# Patient Record
Sex: Female | Born: 1985 | Hispanic: No | Marital: Married | Smoking: Never smoker
Health system: Southern US, Community
[De-identification: ages and names within clinical notes are randomized; demographics above are authoritative.]

## PROBLEM LIST (undated history)

## (undated) DIAGNOSIS — E785 Hyperlipidemia, unspecified: Secondary | ICD-10-CM

## (undated) DIAGNOSIS — J45909 Unspecified asthma, uncomplicated: Secondary | ICD-10-CM

## (undated) HISTORY — PX: MOUTH SURGERY: SHX715

## (undated) HISTORY — DX: Unspecified asthma, uncomplicated: J45.909

## (undated) HISTORY — PX: WISDOM TOOTH EXTRACTION: SHX21

---
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
## 2012-09-08 ENCOUNTER — Ambulatory Visit (INDEPENDENT_AMBULATORY_CARE_PROVIDER_SITE_OTHER): Payer: BC Managed Care – PPO | Admitting: Obstetrics and Gynecology

## 2012-09-08 ENCOUNTER — Encounter: Payer: Self-pay | Admitting: Obstetrics and Gynecology

## 2012-09-08 VITALS — BP 102/74 | Resp 16 | Ht 64.0 in | Wt 126.0 lb

## 2012-09-08 DIAGNOSIS — Z3009 Encounter for other general counseling and advice on contraception: Secondary | ICD-10-CM

## 2012-09-08 DIAGNOSIS — IMO0001 Reserved for inherently not codable concepts without codable children: Secondary | ICD-10-CM

## 2012-09-08 NOTE — Patient Instructions (Signed)
Contraception Choices  Contraception (birth control) is the use of any methods or devices to prevent pregnancy. Below are some methods to help avoid pregnancy.  HORMONAL METHODS   · Contraceptive implant. This is a thin, plastic tube containing progesterone hormone. It does not contain estrogen hormone. Your caregiver inserts the tube in the inner part of the upper arm. The tube can remain in place for up to 3 years. After 3 years, the implant must be removed. The implant prevents the ovaries from releasing an egg (ovulation), thickens the cervical mucus which prevents sperm from entering the uterus, and thins the lining of the inside of the uterus.  · Progesterone-only injections. These injections are given every 3 months by your caregiver to prevent pregnancy. This synthetic progesterone hormone stops the ovaries from releasing eggs. It also thickens cervical mucus and changes the uterine lining. This makes it harder for sperm to survive in the uterus.  · Birth control pills. These pills contain estrogen and progesterone hormone. They work by stopping the egg from forming in the ovary (ovulation). Birth control pills are prescribed by a caregiver. Birth control pills can also be used to treat heavy periods.  · Minipill. This type of birth control pill contains only the progesterone hormone. They are taken every day of each month and must be prescribed by your caregiver.  · Birth control patch. The patch contains hormones similar to those in birth control pills. It must be changed once a week and is prescribed by a caregiver.  · Vaginal ring. The ring contains hormones similar to those in birth control pills. It is left in the vagina for 3 weeks, removed for 1 week, and then a new one is put back in place. The patient must be comfortable inserting and removing the ring from the vagina. A caregiver's prescription is necessary.  · Emergency contraception. Emergency contraceptives prevent pregnancy after unprotected  sexual intercourse. This pill can be taken right after sex or up to 5 days after unprotected sex. It is most effective the sooner you take the pills after having sexual intercourse. Emergency contraceptive pills are available without a prescription. Check with your pharmacist. Do not use emergency contraception as your only form of birth control.  BARRIER METHODS   · Female condom. This is a thin sheath (latex or rubber) that is worn over the penis during sexual intercourse. It can be used with spermicide to increase effectiveness.  · Female condom. This is a soft, loose-fitting sheath that is put into the vagina before sexual intercourse.  · Diaphragm. This is a soft, latex, dome-shaped barrier that must be fitted by a caregiver. It is inserted into the vagina, along with a spermicidal jelly. It is inserted before intercourse. The diaphragm should be left in the vagina for 6 to 8 hours after intercourse.  · Cervical cap. This is a round, soft, latex or plastic cup that fits over the cervix and must be fitted by a caregiver. The cap can be left in place for up to 48 hours after intercourse.  · Sponge. This is a soft, circular piece of polyurethane foam. The sponge has spermicide in it. It is inserted into the vagina after wetting it and before sexual intercourse.  · Spermicides. These are chemicals that kill or block sperm from entering the cervix and uterus. They come in the form of creams, jellies, suppositories, foam, or tablets. They do not require a prescription. They are inserted into the vagina with an applicator before having sexual intercourse.   The process must be repeated every time you have sexual intercourse.  INTRAUTERINE CONTRACEPTION  · Intrauterine device (IUD). This is a T-shaped device that is put in a woman's uterus during a menstrual period to prevent pregnancy. There are 2 types:  · Copper IUD. This type of IUD is wrapped in copper wire and is placed inside the uterus. Copper makes the uterus and  fallopian tubes produce a fluid that kills sperm. It can stay in place for 10 years.  · Hormone IUD. This type of IUD contains the hormone progestin (synthetic progesterone). The hormone thickens the cervical mucus and prevents sperm from entering the uterus, and it also thins the uterine lining to prevent implantation of a fertilized egg. The hormone can weaken or kill the sperm that get into the uterus. It can stay in place for 5 years.  PERMANENT METHODS OF CONTRACEPTION  · Female tubal ligation. This is when the woman's fallopian tubes are surgically sealed, tied, or blocked to prevent the egg from traveling to the uterus.  · Female sterilization. This is when the female has the tubes that carry sperm tied off (vasectomy). This blocks sperm from entering the vagina during sexual intercourse. After the procedure, the man can still ejaculate fluid (semen).  NATURAL PLANNING METHODS  · Natural family planning. This is not having sexual intercourse or using a barrier method (condom, diaphragm, cervical cap) on days the woman could become pregnant.  · Calendar method. This is keeping track of the length of each menstrual cycle and identifying when you are fertile.  · Ovulation method. This is avoiding sexual intercourse during ovulation.  · Symptothermal method. This is avoiding sexual intercourse during ovulation, using a thermometer and ovulation symptoms.  · Post-ovulation method. This is timing sexual intercourse after you have ovulated.  Regardless of which type or method of contraception you choose, it is important that you use condoms to protect against the transmission of sexually transmitted diseases (STDs). Talk with your caregiver about which form of contraception is most appropriate for you.  Document Released: 09/02/2005 Document Revised: 11/25/2011 Document Reviewed: 01/09/2011  ExitCare® Patient Information ©2013 [employer].

## 2012-09-08 NOTE — Progress Notes (Signed)
Pt is here for consultation about IUD insertion.  She went to [facility]  facility for an IUD on two different occasions.  Both times the staff was unable to place the IUd because of cervical stenosis per the pt.  She reports she had a cervical block and used cytotec.  Each insertion attempt was also done at the time of her menses.  She was dxd with a migraine with aura and stopped nuvaring.  She did not like the SE of nexplanon.  She does not think she will remember to take a pill.  She may consider Depo Provera BP 102/74  Resp 16  Ht 5\' 4"  (1.626 m)  Wt 126 lb (57.153 kg)  BMI 21.63 kg/m2  LMP 08/29/2012 Physical Examination: General appearance - alert, well appearing, and in no distress Chest - clear to auscultation, no wheezes, rales or rhonchi, symmetric air entry Heart - normal rate and regular rhythm Abdomen - soft, nontender, nondistended, no masses or organomegaly Pelvic - normal external genitalia, vulva, vagina, cervix, uterus and adnexa, exam declined by the patient Extremities - peripheral pulses normal, no pedal edema, no clubbing or cyanosis Pt desires IUD under US guidance cytotec 200 mcg in vagina 4-6 hrs befor procedure Will use skyla because it is smaller I reviewed all non hormonal and progesterone only contraception with the pt [facility] records

## 2012-09-21 ENCOUNTER — Ambulatory Visit: Payer: Self-pay | Admitting: Internal Medicine

## 2012-09-22 ENCOUNTER — Telehealth: Payer: Self-pay | Admitting: Obstetrics and Gynecology

## 2012-09-22 MED ORDER — MEDROXYPROGESTERONE ACETATE 150 MG/ML IM SUSP: 150.0000 mg | Freq: Once | INTRAMUSCULAR | Status: DC

## 2012-09-22 NOTE — Telephone Encounter (Signed)
Pt informed and rx sent to pharmacy  [name], CMA

## 2012-09-22 NOTE — Telephone Encounter (Signed)
LVM to advise that rx was called to [facility] pharmacy. Advised pt if this was incorrect pharmacy to call us with phone number for correct one.   [name], CMA

## 2012-09-22 NOTE — Telephone Encounter (Signed)
Advised pt that rx was sent to [facility] pharmacy

## 2012-09-22 NOTE — Telephone Encounter (Signed)
Pt may have Depo provera 150 mg IM q 3 months for one year.  Disp # 1 with 3 refills.  Pt should make a lab appointment to come in for the injection when she has her menses and follow up with me in three months

## 2012-09-22 NOTE — Telephone Encounter (Signed)
New pt seen on 09/08/12 for IUD consult. Has decided that she would prefer to have Rx for Depo Provera. Is it ok to call this in?  [name], CMA

## 2013-03-02 ENCOUNTER — Ambulatory Visit: Payer: Self-pay | Admitting: Family Medicine

## 2017-06-24 ENCOUNTER — Other Ambulatory Visit: Payer: Self-pay | Admitting: Gastroenterology

## 2017-06-24 DIAGNOSIS — R1013 Epigastric pain: Secondary | ICD-10-CM

## 2017-06-24 DIAGNOSIS — R140 Abdominal distension (gaseous): Secondary | ICD-10-CM

## 2017-06-27 ENCOUNTER — Ambulatory Visit
Admission: RE | Admit: 2017-06-27 | Discharge: 2017-06-27 | Disposition: A | Discharge status: Home / Self Care | Payer: 59 | Source: Ambulatory Visit | Attending: Gastroenterology | Admitting: Gastroenterology

## 2017-06-27 DIAGNOSIS — R140 Abdominal distension (gaseous): Secondary | ICD-10-CM

## 2017-06-27 DIAGNOSIS — R1013 Epigastric pain: Secondary | ICD-10-CM

## 2018-09-16 NOTE — L&D Delivery Note (Signed)
Pt was admitted for an induction She had cytotec placed and then progressed quickly to C/C /+2  She pushed for 45 min and then had a SVD of one live viable infant in the ROA position. Placenta -S/I. 2nd degree perineal tear closed with 3-0 chomic. Baby to NBN. EBL- 400cc

## 2018-12-01 LAB — OB RESULTS CONSOLE HIV ANTIBODY (ROUTINE TESTING): HIV: NONREACTIVE

## 2018-12-01 LAB — OB RESULTS CONSOLE GC/CHLAMYDIA
Chlamydia: NEGATIVE
Gonorrhea: NEGATIVE

## 2018-12-01 LAB — OB RESULTS CONSOLE ABO/RH: RH Type: POSITIVE

## 2018-12-01 LAB — OB RESULTS CONSOLE ANTIBODY SCREEN: Antibody Screen: NEGATIVE

## 2018-12-01 LAB — OB RESULTS CONSOLE RPR: RPR: NONREACTIVE

## 2018-12-01 LAB — OB RESULTS CONSOLE HEPATITIS B SURFACE ANTIGEN: Hepatitis B Surface Ag: NEGATIVE

## 2018-12-01 LAB — OB RESULTS CONSOLE RUBELLA ANTIBODY, IGM: Rubella: IMMUNE

## 2019-02-26 ENCOUNTER — Other Ambulatory Visit: Payer: Self-pay

## 2019-02-26 ENCOUNTER — Encounter (HOSPITAL_COMMUNITY): Payer: Self-pay | Admitting: *Deleted

## 2019-02-26 ENCOUNTER — Inpatient Hospital Stay (HOSPITAL_COMMUNITY)
Admission: AD | Admit: 2019-02-26 | Discharge: 2019-02-27 | Disposition: A | Discharge status: Home / Self Care | Payer: Managed Care, Other (non HMO) | Attending: Obstetrics and Gynecology | Admitting: Obstetrics and Gynecology

## 2019-02-26 DIAGNOSIS — O26892 Other specified pregnancy related conditions, second trimester: Secondary | ICD-10-CM | POA: Insufficient documentation

## 2019-02-26 DIAGNOSIS — Z0371 Encounter for suspected problem with amniotic cavity and membrane ruled out: Secondary | ICD-10-CM

## 2019-02-26 DIAGNOSIS — Z3A22 22 weeks gestation of pregnancy: Secondary | ICD-10-CM | POA: Insufficient documentation

## 2019-02-26 HISTORY — DX: Hyperlipidemia, unspecified: E78.5

## 2019-02-26 NOTE — MAU Note (Signed)
Have been constipated this wk. I was on the toilet tonight and pushing very hard. Liquid started coming out. When I pushed again more liquid came out. No fld leaks out unless I am pushing. Denies vag bleeding. Good FM. No pain. I do need something for constipation.

## 2019-02-26 NOTE — MAU Provider Note (Signed)
History     CSN: [id number]  Arrival date and time: 02/26/19 2217   First Provider Initiated Contact with Patient 02/26/19 2351      Chief Complaint  Patient presents with  . Vaginal Discharge   HPI  Ms.  Katelyn Wall is a 33 y.o. year old G24P0 female at [redacted]w[redacted]d weeks gestation who presents to MAU reporting she has been leaking fluid since 2330 on 02/26/19. She reports she has been constipated all week and she tried to have a BM tonight. She states she was "pushing really hard on the toilet & liquid came out after pushing 2 times. She denies any further leaking. She now thinks she just leaked urine while straining for the BM. She denies VB or pain. She reports last SI was "last week." She reports good (+) FM. She called her OB office and was instructed to come here for evaluation of LOF. She also reports "having a really good BM after arriving here and feels so much better."  Past Medical History:  Diagnosis Date  . Hyperlipidemia, mild     Past Surgical History:  Procedure Laterality Date  . MOUTH SURGERY    . WISDOM TOOTH EXTRACTION      Family History  Problem Relation Age of Onset  . Cancer Paternal Grandfather        lung cancer  . Cancer Paternal Grandmother        skin cancer  . Cancer Maternal Grandmother        breast cancer/ GGM    Social History   Tobacco Use  . Smoking status: Never Smoker  . Smokeless tobacco: Never Used  Substance Use Topics  . Alcohol use: Not Currently  . Drug use: Never    Allergies: No Known Allergies  Medications Prior to Admission  Medication Sig Dispense Refill Last Dose  . Prenatal Vit-Fe Fumarate-FA (PRENATAL MULTIVITAMIN) TABS tablet Take 1 tablet by mouth daily at 12 noon.     . medroxyPROGESTERone (DEPO-PROVERA) 150 MG/ML injection Inject 1 mL (150 mg total) into the muscle once. 1 mL 3     Review of Systems Physical Exam   Blood pressure 117/66, pulse 99, temperature 98 F (36.7 C), resp. rate 18, height 5\' 4"   (1.626 m), weight 76.7 kg.  Physical Exam  Nursing note and vitals reviewed. Constitutional: She is oriented to person, place, and time. She appears well-developed.  HENT:  Head: Normocephalic and atraumatic.  Eyes: Pupils are equal, round, and reactive to light.  Neck: Normal range of motion.  Cardiovascular: Normal rate.  Respiratory: Effort normal.  GI: Soft.  Genitourinary:    Genitourinary Comments: Effacement (%): Thick Cervical Position: Posterior Presentation: Undeterminable Exam by: Sunday Corn, CNM    Musculoskeletal: Normal range of motion.  Neurological: She is alert and oriented to person, place, and time.  Skin: Skin is warm and dry.  Psychiatric: She has a normal mood and affect. Her behavior is normal. Judgment and thought content normal.    MAU Course  Procedures  MDM Speculum exam SVE Fern slide  Results for orders placed or performed during the hospital encounter of 02/26/19 (from the past 24 hour(s))  POCT fern test     Status: Normal   Collection Time: 02/27/19 12:33 AM  Result Value Ref Range   POCT Fern Test Negative = intact amniotic membranes     Assessment and Plan  No leakage of amniotic fluid into vagina  - Reassurance given that her membranes had not ruptured  d/t straining for BM - Discharge patient, - Keep scheduled appt with [facility] as scheduled - Patient verbalized an understanding of the plan of care and agrees.    Raelyn Moraolitta Hassan Blackshire, MSN, CNM 02/27/2019, 1:06 AM

## 2019-02-27 DIAGNOSIS — Z0371 Encounter for suspected problem with amniotic cavity and membrane ruled out: Secondary | ICD-10-CM | POA: Diagnosis not present

## 2019-02-27 DIAGNOSIS — Z3A22 22 weeks gestation of pregnancy: Secondary | ICD-10-CM | POA: Diagnosis not present

## 2019-02-27 DIAGNOSIS — O26892 Other specified pregnancy related conditions, second trimester: Secondary | ICD-10-CM | POA: Diagnosis present

## 2019-02-27 LAB — POCT FERN TEST: POCT Fern Test: NEGATIVE

## 2019-05-26 IMAGING — US US ABDOMEN COMPLETE
1 series · 14 of 25 positions shown · non-contrast
Comparison: None.

CLINICAL DATA: Epigastric pain

EXAM:
ABDOMEN ULTRASOUND COMPLETE

[Series 1: us abdomen complete · 0.22mm/px · 14 of 104 slices shown]
[im 1/104]
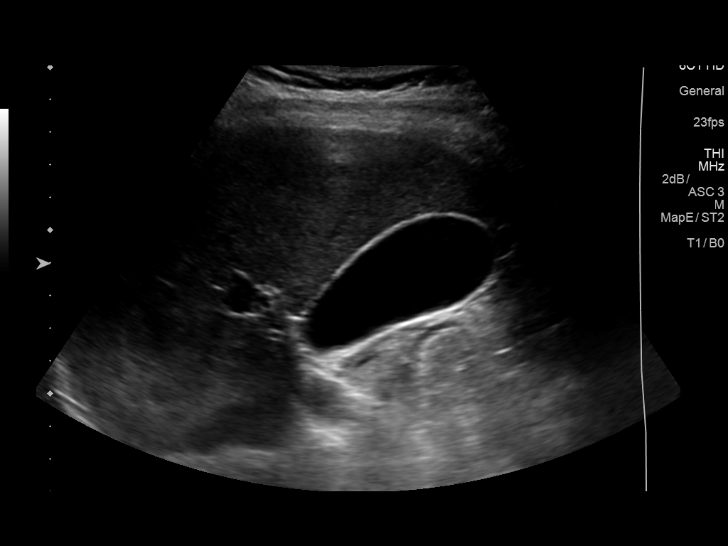
[im 9/104]
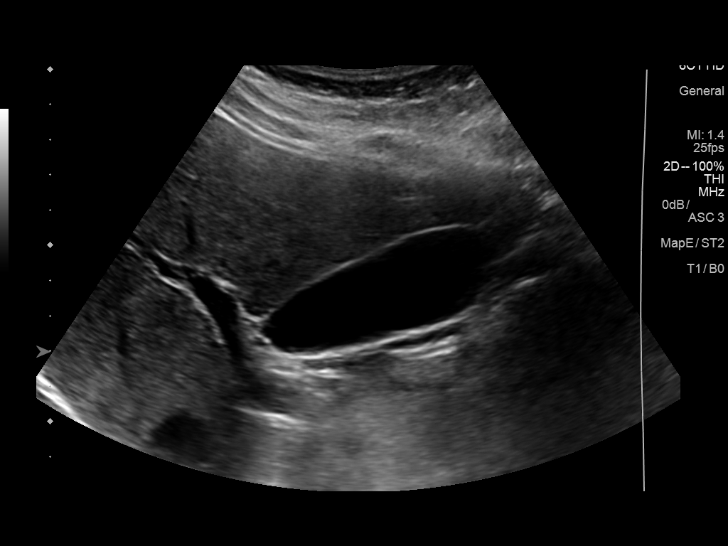
[im 18/104]
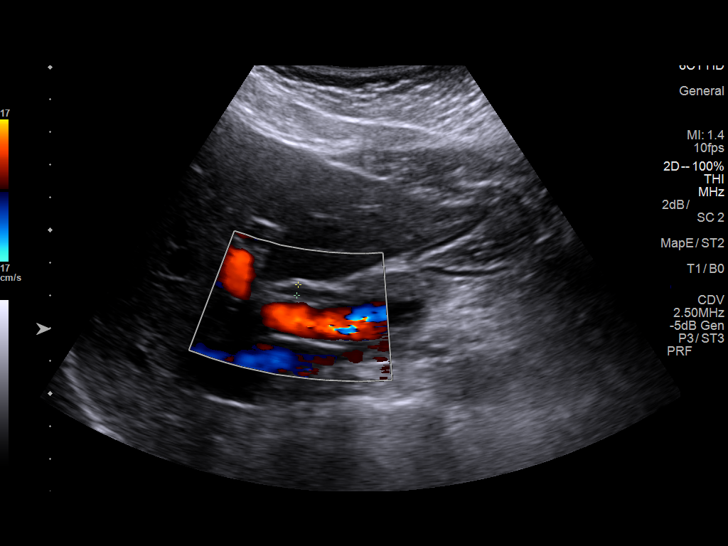
[im 26/104]
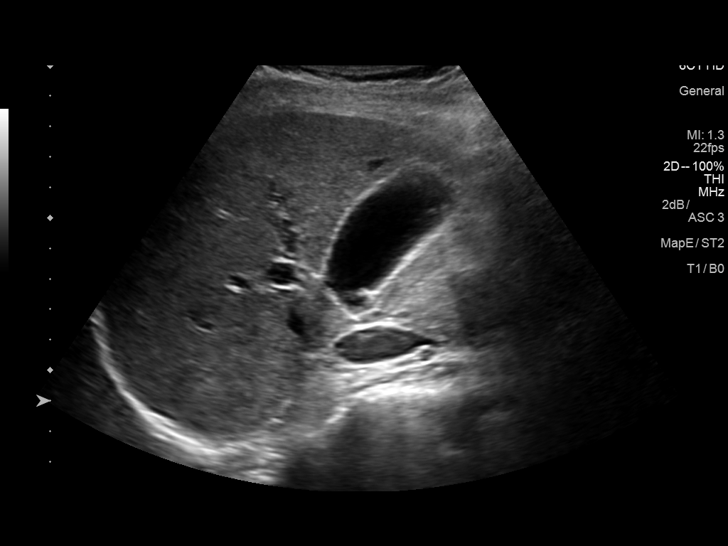
[im 35/104]
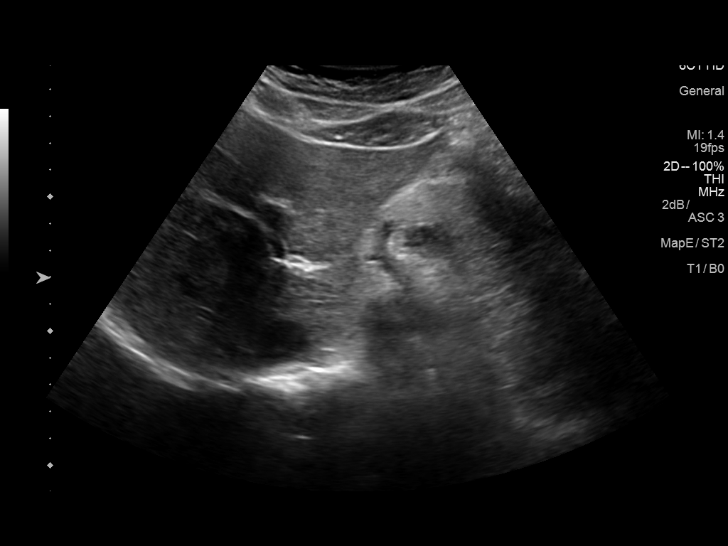
[im 39/104]
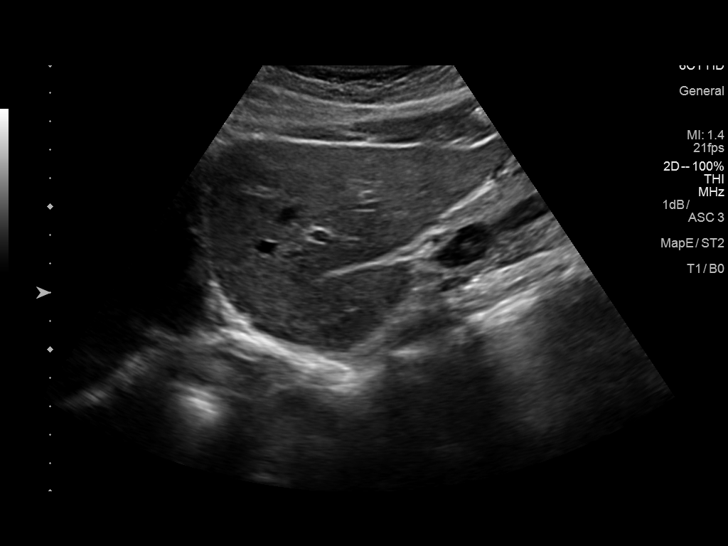
[im 48/104]
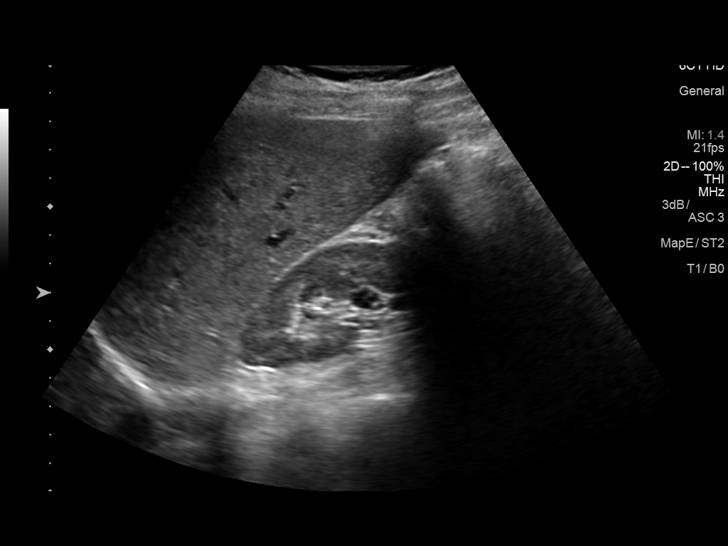
[im 56/104]
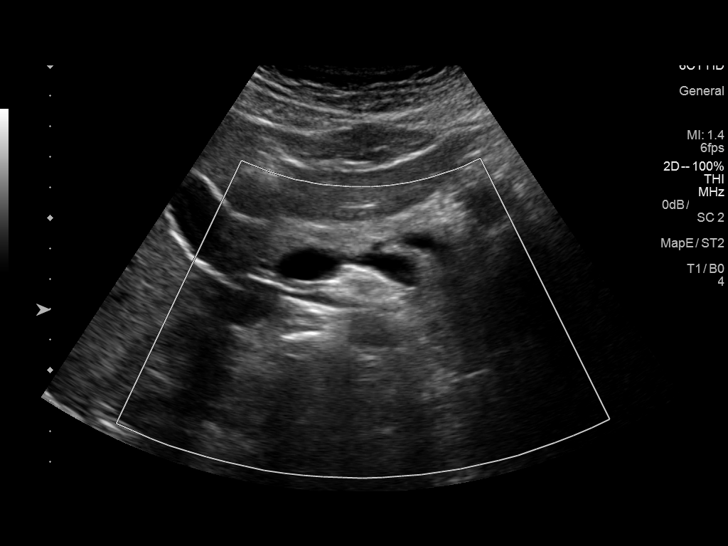
[im 65/104]
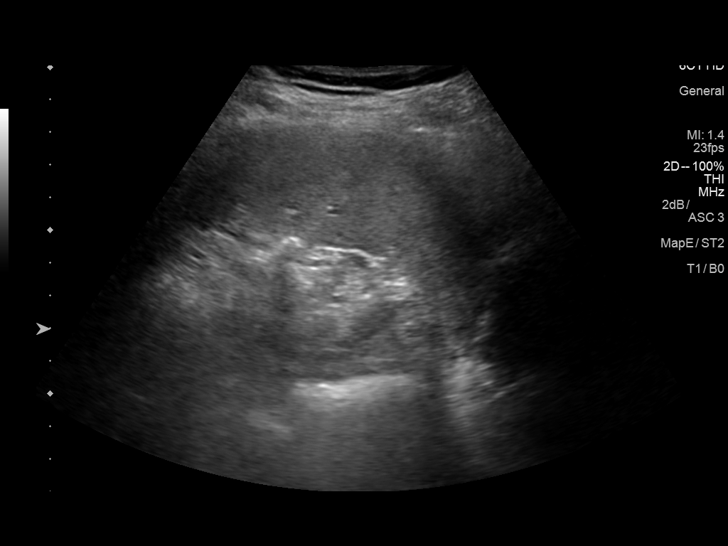
[im 69/104]
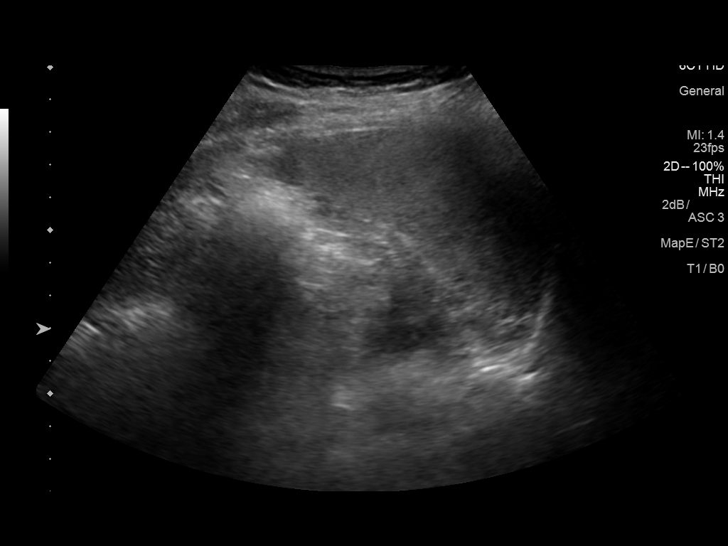
[im 78/104]
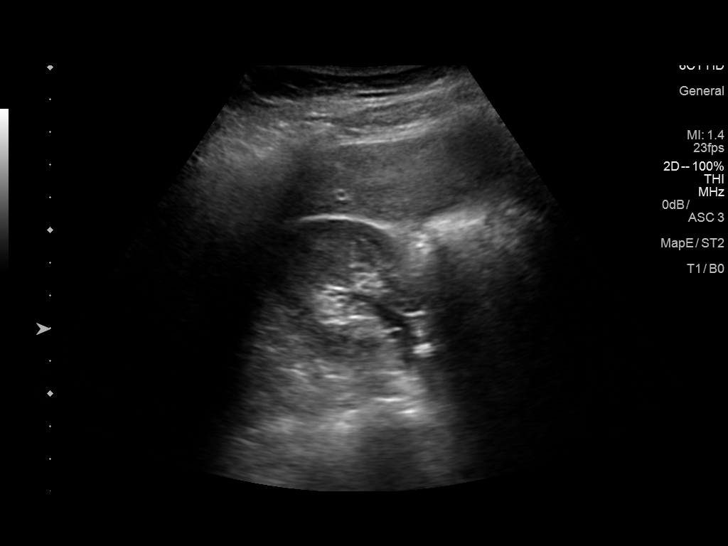
[im 86/104]
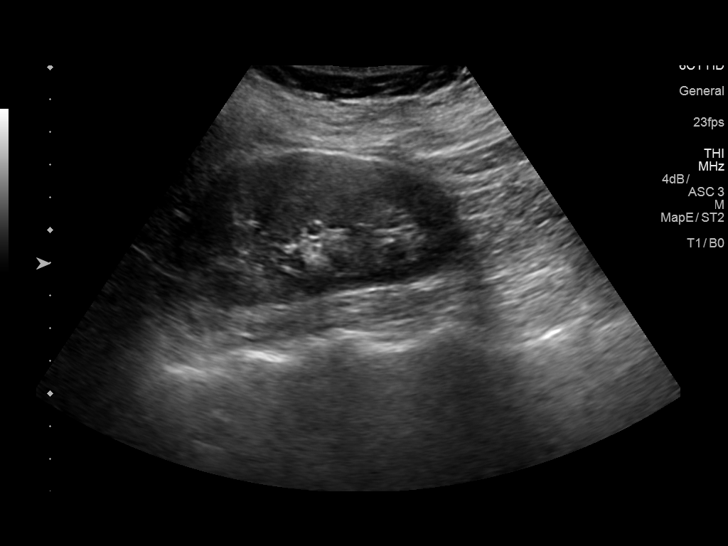
[im 95/104]
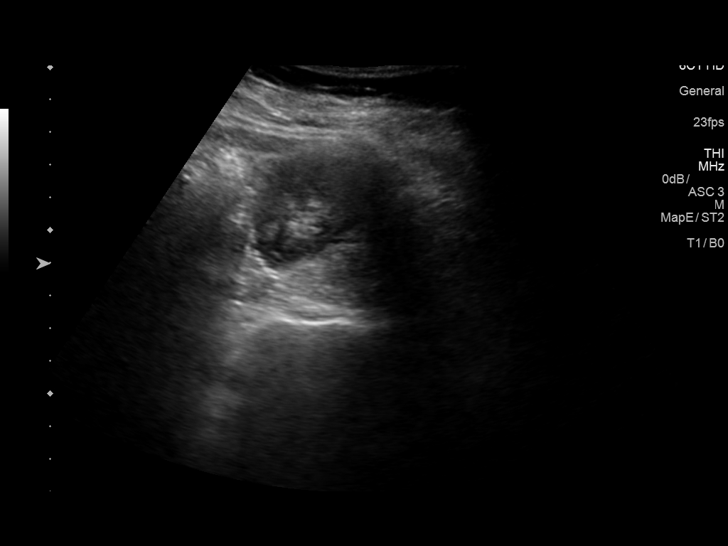
[im 104/104]
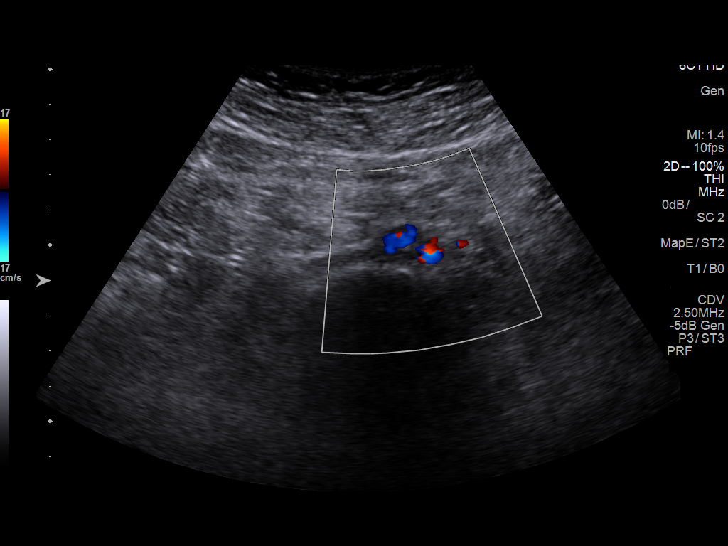

[14 of 25 positions shown; findings below may reference images not displayed]

FINDINGS: Gallbladder: No gallstones or wall thickening visualized. No
sonographic Murphy sign noted by sonographer.

Common bile duct: Diameter: Normal caliber, 3 mm

Liver: No focal lesion identified. Within normal limits in
parenchymal echogenicity. Portal vein is patent on color Doppler
imaging with normal direction of blood flow towards the liver.

IVC: No abnormality visualized.

Pancreas: Visualized portion unremarkable.

Spleen: Size and appearance within normal limits.

Right Kidney: Length: 10.2 cm. Echogenicity within normal limits. No
mass or hydronephrosis visualized.

Left Kidney: Length: 11.3 cm. Echogenicity within normal limits. No
mass or hydronephrosis visualized.

Abdominal aorta: No aneurysm visualized.

Other findings: None.
IMPRESSION: Normal ultrasound.

## 2019-06-01 LAB — OB RESULTS CONSOLE GBS: GBS: NEGATIVE

## 2019-07-05 ENCOUNTER — Other Ambulatory Visit: Payer: Self-pay | Admitting: Obstetrics and Gynecology

## 2019-07-05 ENCOUNTER — Telehealth (HOSPITAL_COMMUNITY): Payer: Self-pay | Admitting: *Deleted

## 2019-07-05 ENCOUNTER — Other Ambulatory Visit (HOSPITAL_COMMUNITY): Payer: Managed Care, Other (non HMO)

## 2019-07-05 ENCOUNTER — Encounter (HOSPITAL_COMMUNITY): Payer: Self-pay | Admitting: *Deleted

## 2019-07-05 NOTE — Telephone Encounter (Signed)
Preadmission screen  

## 2019-07-06 ENCOUNTER — Other Ambulatory Visit: Payer: Self-pay

## 2019-07-06 ENCOUNTER — Other Ambulatory Visit (HOSPITAL_COMMUNITY): Payer: Self-pay | Admitting: *Deleted

## 2019-07-06 ENCOUNTER — Other Ambulatory Visit (HOSPITAL_COMMUNITY)
Admission: RE | Admit: 2019-07-06 | Discharge: 2019-07-06 | Disposition: A | Discharge status: Home / Self Care | Payer: Managed Care, Other (non HMO) | Source: Ambulatory Visit | Attending: Obstetrics & Gynecology | Admitting: Obstetrics & Gynecology

## 2019-07-06 LAB — SARS CORONAVIRUS 2 BY RT PCR (HOSPITAL ORDER, PERFORMED IN ~~LOC~~ HOSPITAL LAB): SARS Coronavirus 2: NEGATIVE

## 2019-07-06 NOTE — MAU Note (Signed)
Asymptomatic, swab collected. 

## 2019-07-07 ENCOUNTER — Inpatient Hospital Stay (HOSPITAL_COMMUNITY): Payer: Managed Care, Other (non HMO)

## 2019-07-08 ENCOUNTER — Encounter (HOSPITAL_COMMUNITY): Payer: Self-pay

## 2019-07-08 ENCOUNTER — Other Ambulatory Visit: Payer: Self-pay

## 2019-07-08 ENCOUNTER — Encounter (HOSPITAL_COMMUNITY): Payer: Self-pay | Admitting: Anesthesiology

## 2019-07-08 ENCOUNTER — Inpatient Hospital Stay (HOSPITAL_COMMUNITY)
Admission: AD | Admit: 2019-07-08 | Discharge: 2019-07-09 | DRG: 807 | Disposition: A | Discharge status: Home / Self Care | Payer: Managed Care, Other (non HMO) | Attending: Obstetrics and Gynecology | Admitting: Obstetrics and Gynecology

## 2019-07-08 DIAGNOSIS — Z3490 Encounter for supervision of normal pregnancy, unspecified, unspecified trimester: Secondary | ICD-10-CM

## 2019-07-08 DIAGNOSIS — Z0371 Encounter for suspected problem with amniotic cavity and membrane ruled out: Secondary | ICD-10-CM

## 2019-07-08 DIAGNOSIS — O480 Post-term pregnancy: Principal | ICD-10-CM | POA: Diagnosis present

## 2019-07-08 DIAGNOSIS — Z20828 Contact with and (suspected) exposure to other viral communicable diseases: Secondary | ICD-10-CM | POA: Diagnosis present

## 2019-07-08 DIAGNOSIS — Z3A41 41 weeks gestation of pregnancy: Secondary | ICD-10-CM

## 2019-07-08 LAB — CBC
HCT: 42.9 % (ref 36.0–46.0)
Hemoglobin: 14.1 g/dL (ref 12.0–15.0)
MCH: 30 pg (ref 26.0–34.0)
MCHC: 32.9 g/dL (ref 30.0–36.0)
MCV: 91.3 fL (ref 80.0–100.0)
Platelets: 150 10*3/uL (ref 150–400)
RBC: 4.7 MIL/uL (ref 3.87–5.11)
RDW: 12.6 % (ref 11.5–15.5)
WBC: 9.6 10*3/uL (ref 4.0–10.5)
nRBC: 0 % (ref 0.0–0.2)

## 2019-07-08 LAB — TYPE AND SCREEN
ABO/RH(D): A POS
Antibody Screen: NEGATIVE

## 2019-07-08 LAB — RPR: RPR Ser Ql: NONREACTIVE

## 2019-07-08 LAB — ABO/RH: ABO/RH(D): A POS

## 2019-07-08 MED ORDER — PHENYLEPHRINE 40 MCG/ML (10ML) SYRINGE FOR IV PUSH (FOR BLOOD PRESSURE SUPPORT): 80.0000 ug | PREFILLED_SYRINGE | INTRAVENOUS | Status: DC | PRN

## 2019-07-08 MED ORDER — ZOLPIDEM TARTRATE 5 MG PO TABS: 5.0000 mg | ORAL_TABLET | Freq: Every evening | ORAL | Status: DC | PRN

## 2019-07-08 MED ORDER — OXYCODONE-ACETAMINOPHEN 5-325 MG PO TABS: 1.0000 | ORAL_TABLET | ORAL | Status: DC | PRN

## 2019-07-08 MED ORDER — ONDANSETRON HCL 4 MG/2ML IJ SOLN: 4.0000 mg | Freq: Four times a day (QID) | INTRAMUSCULAR | Status: DC | PRN

## 2019-07-08 MED ORDER — SIMETHICONE 80 MG PO CHEW: 80.0000 mg | CHEWABLE_TABLET | ORAL | Status: DC | PRN

## 2019-07-08 MED ORDER — OXYTOCIN BOLUS FROM INFUSION
500.0000 mL | Freq: Once | INTRAVENOUS | Status: AC
  Administered 2019-07-08: 500 mL/h via INTRAVENOUS

## 2019-07-08 MED ORDER — FENTANYL CITRATE (PF) 100 MCG/2ML IJ SOLN
INTRAMUSCULAR | Status: AC
  Filled 2019-07-08: qty 2

## 2019-07-08 MED ORDER — IBUPROFEN 600 MG PO TABS
600.0000 mg | ORAL_TABLET | Freq: Four times a day (QID) | ORAL | Status: DC
  Administered 2019-07-08 – 2019-07-09 (×5): 600 mg via ORAL
  Filled 2019-07-08 (×5): qty 1

## 2019-07-08 MED ORDER — OXYCODONE-ACETAMINOPHEN 5-325 MG PO TABS: 2.0000 | ORAL_TABLET | ORAL | Status: DC | PRN

## 2019-07-08 MED ORDER — WITCH HAZEL-GLYCERIN EX PADS: 1.0000 "application " | MEDICATED_PAD | CUTANEOUS | Status: DC | PRN

## 2019-07-08 MED ORDER — COCONUT OIL OIL: 1.0000 "application " | TOPICAL_OIL | Status: DC | PRN

## 2019-07-08 MED ORDER — SENNOSIDES-DOCUSATE SODIUM 8.6-50 MG PO TABS
2.0000 | ORAL_TABLET | ORAL | Status: DC
  Administered 2019-07-08: 2 via ORAL
  Filled 2019-07-08: qty 2

## 2019-07-08 MED ORDER — OXYTOCIN 40 UNITS IN NORMAL SALINE INFUSION - SIMPLE MED
2.5000 [IU]/h | INTRAVENOUS | Status: DC
  Filled 2019-07-08: qty 1000

## 2019-07-08 MED ORDER — DIPHENHYDRAMINE HCL 50 MG/ML IJ SOLN: 12.5000 mg | INTRAMUSCULAR | Status: DC | PRN

## 2019-07-08 MED ORDER — EPHEDRINE 5 MG/ML INJ: 10.0000 mg | INTRAVENOUS | Status: DC | PRN

## 2019-07-08 MED ORDER — MISOPROSTOL 25 MCG QUARTER TABLET
25.0000 ug | ORAL_TABLET | ORAL | Status: DC | PRN
  Administered 2019-07-08: 25 ug via VAGINAL
  Filled 2019-07-08 (×2): qty 1

## 2019-07-08 MED ORDER — LACTATED RINGERS IV SOLN: 500.0000 mL | INTRAVENOUS | Status: DC | PRN

## 2019-07-08 MED ORDER — LIDOCAINE HCL (PF) 1 % IJ SOLN
30.0000 mL | INTRAMUSCULAR | Status: DC | PRN
  Administered 2019-07-08: 30 mL via SUBCUTANEOUS
  Filled 2019-07-08: qty 30

## 2019-07-08 MED ORDER — BUTORPHANOL TARTRATE 1 MG/ML IJ SOLN
1.0000 mg | INTRAMUSCULAR | Status: DC | PRN
  Administered 2019-07-08: 1 mg via INTRAVENOUS
  Filled 2019-07-08: qty 1

## 2019-07-08 MED ORDER — ONDANSETRON HCL 4 MG PO TABS: 4.0000 mg | ORAL_TABLET | ORAL | Status: DC | PRN

## 2019-07-08 MED ORDER — LACTATED RINGERS IV SOLN: 500.0000 mL | Freq: Once | INTRAVENOUS | Status: DC

## 2019-07-08 MED ORDER — FENTANYL-BUPIVACAINE-NACL 0.5-0.125-0.9 MG/250ML-% EP SOLN
12.0000 mL/h | EPIDURAL | Status: DC | PRN
  Filled 2019-07-08: qty 250

## 2019-07-08 MED ORDER — ACETAMINOPHEN 325 MG PO TABS: 650.0000 mg | ORAL_TABLET | ORAL | Status: DC | PRN

## 2019-07-08 MED ORDER — BENZOCAINE-MENTHOL 20-0.5 % EX AERO: 1.0000 "application " | INHALATION_SPRAY | CUTANEOUS | Status: DC | PRN

## 2019-07-08 MED ORDER — SOD CITRATE-CITRIC ACID 500-334 MG/5ML PO SOLN: 30.0000 mL | ORAL | Status: DC | PRN

## 2019-07-08 MED ORDER — FENTANYL-BUPIVACAINE-NACL 0.5-0.125-0.9 MG/250ML-% EP SOLN: 12.0000 mL/h | EPIDURAL | Status: DC | PRN

## 2019-07-08 MED ORDER — ONDANSETRON HCL 4 MG/2ML IJ SOLN: 4.0000 mg | INTRAMUSCULAR | Status: DC | PRN

## 2019-07-08 MED ORDER — MEASLES, MUMPS & RUBELLA VAC IJ SOLR: 0.5000 mL | Freq: Once | INTRAMUSCULAR | Status: DC

## 2019-07-08 MED ORDER — TETANUS-DIPHTH-ACELL PERTUSSIS 5-2.5-18.5 LF-MCG/0.5 IM SUSP: 0.5000 mL | Freq: Once | INTRAMUSCULAR | Status: DC

## 2019-07-08 MED ORDER — TERBUTALINE SULFATE 1 MG/ML IJ SOLN: 0.2500 mg | Freq: Once | INTRAMUSCULAR | Status: DC | PRN

## 2019-07-08 MED ORDER — DIBUCAINE (PERIANAL) 1 % EX OINT: 1.0000 "application " | TOPICAL_OINTMENT | CUTANEOUS | Status: DC | PRN

## 2019-07-08 MED ORDER — LACTATED RINGERS IV SOLN
INTRAVENOUS | Status: DC
  Administered 2019-07-08: via INTRAVENOUS

## 2019-07-08 NOTE — Lactation Note (Signed)
This note was copied from a baby's chart. Lactation Consultation Note  Patient Name: [name] FVCBS'W Date: 07/08/2019 Reason for consult: Initial assessment;Primapara;1st time breastfeeding;Term  9675 - 1632 - I conducted an initial consult with Ms. Wyke. She states that her daughter latched in recovery and again around 10 am. Ms. Alkema has attempted to latch her since that time, but baby has been too sleepy to wake.   I offered to assist and she agreed. We attempted to wake baby up, and we placed her in cradle hold. Baby went to sleep at the breast and would not latch.   I encouraged Ms. Trethewey to hand express and feed back any EBM. I reviewed key points from the [facility] guide, and reviewed feeding cues, and output expectations.  I helped swaddle baby, and encouraged her to try to feed the baby upon demand and wake baby to feed.  Maternal Data Formula Feeding for Exclusion: No Has patient been taught Hand Expression?: Yes Does the patient have breastfeeding experience prior to this delivery?: No  Feeding Feeding Type: Breast Fed  LATCH Score Latch: Too sleepy or reluctant, no latch achieved, no sucking elicited.  Audible Swallowing: None  Type of Nipple: Everted at rest and after stimulation  Comfort (Breast/Nipple): Soft / non-tender  Hold (Positioning): Assistance needed to correctly position infant at breast and maintain latch.  LATCH Score: 5  Interventions Interventions: Breast feeding basics reviewed;Assisted with latch;Skin to skin;Hand express;Adjust position  Lactation Tools Discussed/Used     Consult Status Consult Status: Follow-up Date: 07/09/19 Follow-up type: In-patient    Lenore Manner 07/08/2019, 5:11 PM

## 2019-07-08 NOTE — H&P (Signed)
Katelyn Wall is an 33 y.o. G1P0 [redacted]w[redacted]d white female who was admitted to L&D for an induction for post dates. PNC was uncomplicated. GBS-neg. / NIPT -wnl/ Nl 3 hr OGTT  Past Medical History:  Diagnosis Date  . Asthma   . Hyperlipidemia, mild     Past Surgical History:  Procedure Laterality Date  . MOUTH SURGERY    . WISDOM TOOTH EXTRACTION      Family History  Problem Relation Age of Onset  . Cancer Paternal Grandfather        lung cancer  . Cancer Paternal Grandmother        skin cancer  . Varicose Veins Paternal Grandmother   . Cancer Maternal Grandmother        breast cancer/ GGM  . Varicose Veins Maternal Grandmother   . Varicose Veins Mother   . Hyperlipidemia Father   . Stroke Father    Social History:  reports that she has never smoked. She has never used smokeless tobacco. She reports previous alcohol use. She reports that she does not use drugs.  Allergies: No Known Allergies  Medications Prior to Admission  Medication Sig Dispense Refill  . Prenatal Vit-Fe Fumarate-FA (PRENATAL MULTIVITAMIN) TABS tablet Take 1 tablet by mouth daily at 12 noon.         Blood pressure 133/74, pulse 71, temperature 98.4 F (36.9 C), temperature source Oral, resp. rate 15, height 5\' 4"  (1.626 m), weight 84.6 kg. General appearance: alert and cooperative Abdomen: gravid, non tender   Lab Results  Component Value Date   WBC 9.6 07/08/2019   HGB 14.1 07/08/2019   HCT 42.9 07/08/2019   MCV 91.3 07/08/2019   PLT 150 07/08/2019   No results found for: PREGTESTUR, PREGSERUM, HCG, HCGQUANT    Patient Active Problem List   Diagnosis Date Noted  . Indication for care in labor or delivery 07/08/2019  . No leakage of amniotic fluid into vagina 02/27/2019   IMP/ IUP at 41 wks         Post dates Plan/ Admit  Olga Millers 07/08/2019, 6:36 AM

## 2019-07-09 LAB — CBC
HCT: 40.2 % (ref 36.0–46.0)
Hemoglobin: 13.4 g/dL (ref 12.0–15.0)
MCH: 30.7 pg (ref 26.0–34.0)
MCHC: 33.3 g/dL (ref 30.0–36.0)
MCV: 92.2 fL (ref 80.0–100.0)
Platelets: 126 10*3/uL — ABNORMAL LOW (ref 150–400)
RBC: 4.36 MIL/uL (ref 3.87–5.11)
RDW: 13.1 % (ref 11.5–15.5)
WBC: 10.4 10*3/uL (ref 4.0–10.5)
nRBC: 0 % (ref 0.0–0.2)

## 2019-07-09 MED ORDER — IBUPROFEN 600 MG PO TABS: 600.0000 mg | ORAL_TABLET | Freq: Four times a day (QID) | ORAL | 0 refills | Status: AC

## 2019-07-09 NOTE — Progress Notes (Signed)
CSW received consult for adjustment disorder.  CSW met with MOB to offer support and complete assessment.    MOB laying in bed holding infant, when CSW entered the room. CSW introduced self and explained reason for consult to which MOB expressed understanding. MOB very pleasant and engaged throughout assessment and appeared to be well-bonded to infant. CSW inquired about MOB's diagnosis of adjustment disorder and MOB denied diagnosis. MOB reported she felt good during pregnancy but did acknowledgd experiencing some mood swings due to her hormones. MOB denied currently being on medications or receiving counseling. 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 CSW recommended self-evaluation during the postpartum time period using the New Mom Checklist from Postpartum Progress and encouraged MOB to contact a medical professional if symptoms are noted at any time. MOB appeared to be in good-spirits and did not appear to be displaying any acute mental health symptoms. MOB denied any current SI, HI or DV and reported having good support from FOB, her mother-in-law, her father-in-law and her mother.    MOB confirmed having all essential items for infant once discharged and stated infant would be sleeping in a crib of bassinet once home. CSW provided review of Sudden Infant Death Syndrome (SIDS) precautions and safe sleeping habits.    CSW identifies no further need for intervention and no barriers to discharge at this time.  Elijio Miles, [place]  [facility] [employer] [phone]

## 2019-07-09 NOTE — Lactation Note (Addendum)
This note was copied from a baby's chart. Lactation Consultation Note  Patient Name: Katelyn Wall GGEZM'O Date: 07/09/2019 Reason for consult: Follow-up assessment;Infant weight loss;Primapara;Term  LC in to visit with P2 Mom of term baby at 41 hrs old.  Baby is at 7.8% weight loss, output WNL, non since 0300.  Mom reports + breast changes with pregnancy.   Mom states baby has been breastfeeding longer since last night after a slow start yesterday due to sleepiness and emesis.     Baby swaddled in crib after having fed for 40 mins an hour before.  Reviewed breast massage and hand expression, small glistening of colostrum noted.  Baby unwrapped and undressed baby to assist with positioning baby at the breast.  After a couple attempts, baby opened wide, and Mom assisted with using cross cradle hold rather than cradle. Mom initially was pulling breast flat and using cradle hold.  Baby able to latch deeply and sustain latch.  Tongue noted cupped under nipple. LC unable to identify more than 2 swallows while observing baby for 15 mins.   Mom taught to use breast compression during feedings.  Nipple rounded and not pinched after feeding.  Shared concern with Mom about 7.8% weight loss in 24 hrs.  Mom encouraged to post breastfeed double pump to stimulate her milk supply, and offer baby EBM.  Mom does not want to give baby formula if possible.   Set up DEBP and assisted Mom with her first pumping.  Unable to express any colostrum currently.  Mom has a Medela DEBP at home.  Talked to RN about re-weighing baby this afternoon to make sure weight loss is stable.  Baby to see Pediatrician in am after discharge.   Plan- 1- Keep baby STS as much as possible 2- Offer breast with cues, goal of >8 times per 24 hrs.  Awaken baby well at 3 hrs if sleepy. 3- Pump both breasts 15-20 mins, adding breast massage and hand expression. 4- Call prn.  Recommended OP lactation follow-up  Addendum- Re-weigh 8.4%,  to assist Mom with SNS at the breast with formula per volume guidelines.  Request for OP lactation follow-up sent to clinic.  Mom is committed to breastfeeding.  Teary today.    Interventions Interventions: Breast feeding basics reviewed;Assisted with latch;Skin to skin;Breast massage;Hand express;Breast compression;Adjust position;Support pillows;Position options  Lactation Tools Discussed/Used Tools: Pump Breast pump type: Double-Electric Breast Pump WIC Program: No Pump Review: Setup, frequency, and cleaning;Milk Storage Initiated by:: Cipriano Mile RN IBCLC Date initiated:: 07/09/19   Consult Status Consult Status: Follow-up Date: 07/09/19 Follow-up type: In-patient    Broadus John 07/09/2019, 11:36 AM

## 2019-07-09 NOTE — Discharge Summary (Signed)
Obstetric Discharge Summary Reason for Admission: induction of labor Prenatal Procedures: none Intrapartum Procedures: spontaneous vaginal delivery Postpartum Procedures: none Complications-Operative and Postpartum: 2 degree perineal laceration Hemoglobin  Date Value Ref Range Status  07/09/2019 13.4 12.0 - 15.0 g/dL Final   HCT  Date Value Ref Range Status  07/09/2019 40.2 36.0 - 46.0 % Final    Physical Exam:  General: alert, cooperative and appears stated age 33: appropriate Uterine Fundus: firm DVT Evaluation: No evidence of DVT seen on physical exam.  Discharge Diagnoses: Term Pregnancy-delivered  Discharge Information: Date: 07/09/2019 Activity: pelvic rest Diet: routine Medications: PNV and Ibuprofen Condition: stable Instructions: refer to practice specific booklet Discharge to: home Follow-up Information    Olga Millers, MD Follow up in 4 week(s).   Specialty: Obstetrics and Gynecology Contact information: [address] [id number] [phone]           Newborn Data: Live born female  Birth Weight: 7 lb 14.1 oz (3575 g) APGAR: 8, 9  Newborn Delivery   Birth date/time: 07/08/2019 06:44:00 Delivery type: Vaginal, Spontaneous      Home with mother.  [place] 07/09/2019, 9:42 AM

## 2019-07-22 ENCOUNTER — Ambulatory Visit: Payer: Self-pay

## 2019-07-22 NOTE — Lactation Note (Signed)
This note was copied from a baby's chart. Lactation Consultation Note  Patient Name: Katelyn Wall ZYSAY'T Date: 07/22/2019     07/22/2019  Name: Jenaye Rickert MRN: [id number] Date of Birth: 07/08/2017 Gestational Age: Gestational Age: [redacted]w[redacted]d Birth Weight: 126.1 oz Weight today:  Weight: 8 lb 1.3 oz (7023 g)  83 week old term infant presents today with mom for feeding assessment.   Infant has gained 371 grams in the last 13 days with an average daily weight gain of 28 grams per day.   Mom reports infant is 80%  EBM / 20% formula. Mom feels like BF is going well overall. Infant is sleepy at the breast at times. Infant sometimes hard to wake up to feed.   Infant feeds about every 3 hours and feeds better with longer time between feedings. For 15-30 minutes. She can eat a little sooner during the day also.   Mom was giving infant a bottle after each feeding. She is now giving the bottle with hunger cues.   Mom has to keep infant awake at the breast as needed. Mom stimulates infant as needed with feeding. Mom feeds infant with clothes on most of the time unless infant is sleepy.   Infant with thick labial frenulum that inserts at the bottom of the gum ridge. Upper lip with some tightness although lip flanges well on the breast. Infant with posterior lingual frenulum. Infant with good tongue mobility. Infant with intermittent clicking on the breast. Infant feeds using several types of bottles and infant with no choking or drooling per mom.   Infant awake and alert and is feeding well today. Mom did great with latching and supported infant well with feeding. Infant active and alert at the breast. Mom with no pain with feeding. Nipple rounded post feeding.   Discussed normal newborn feeding behavior and normalcy of cluster feeding. Discussed with mom that infant is doing well with feeding and praised mom for her efforts in feeding her infant.   Infant to follow up with Dr. Truddie Coco in  2 weeks. Infant to follow up with Lactation as needed. Mom aware of BF Support Groups.      General Information: Mother's reason for visit: Feeding assessment   Lactation consultant: Nonah Mattes RN,IBCLC Breastfeeding experience: BF every 3 hours with come supplement   Maternal medications: Other, Pre-natal vitamin(Probiotic, Mother's Milk Tea, Lactation Cookies)  Breastfeeding History: Frequency of breast feeding: every 2.5-3 hours Duration of feeding: 20-30 minutes  Supplementation: Supplement method: bottle(Medela, Enfamil Nipples. Avent) Brand: Enfamil Formula volume: .5-1 ounce if already BF, 2.5-3 ounces if not BF Formula frequency: 2-3 x a day   Breast milk volume: 0.5-1 ounce Breast milk frequency: 2-3 x a day   Pump type: Medela pump in style Pump frequency: not much now, sometimes uses the [employer] Pump volume: 0.5 - 1 ounces  Infant Output Assessment: Voids per 24 hours: 8+ Urine color: Clear yellow Stools per 24 hours: 8+ Stool color: Yellow  Breast Assessment: Breast: Soft, Compressible Nipple: Erect Pain level: 0(some pain with initial latch, relatches as needed) Pain interventions: Bra, Other, Breast pump(Nipple Balm)  Feeding Assessment: Infant oral assessment: Variance Infant oral assessment comment: see note Positioning: Cross cradle(right breast, 10 minutes + another 10 minutes) Latch: 2 - Grasps breast easily, tongue down, lips flanged, rhythmical sucking. Audible swallowing: 2 - Spontaneous and intermittent Type of nipple: 2 - Everted at rest and after stimulation Comfort: 2 - Soft/non-tender Hold: 2 - No assistance needed to  correctly position infant at breast LATCH score: 10 Latch assessment: Deep Lips flanged: Yes Suck assessment: Displays both   Pre-feed weight: 3666 grams/3700 grams Post feed weight: 3684 grams/3714 grams Amount transferred: 18 ml/14 ml Amount supplemented: 0  Additional Feeding Assessment: Infant oral assessment:  Variance Infant oral assessment comment: see note Positioning: Cross cradle Latch: 2 - Grasps breast easily, tongue down, lips flanged, rhythmical sucking. Audible swallowing: 2 - Spontaneous and intermittent Type of nipple: 2 - Everted at rest and after stimulation Comfort: 2 - Soft/non-tender Hold: 2 - No assistance needed to correctly position infant at breast LATCH score: 10 Latch assessment: Deep Lips flanged: Yes Suck assessment: Displays both   Pre-feed weight: 3684 grams Post feed weight: 3720 grams Amount transferred: 36 ml    Totals: Total amount transferred: 68 ml Total supplement given: 0 Total amount pumped post feed: did not pump   Plan: 1. Offer infant the breast with feeding cues 2. Keep infant awake at the breast as needed 3. Feed infant skin to skin to help keep her awake as needed  4. Offer both breasts with each feeding 5. Empty the first breast before offering the second breast 6. Offer infant a bottle of pumped breast milk or formula as needed after breast feeding if infant is still cueing to feed 7. Offer infant the bottle using the paced bottle feeding method 9. Infant needs about 68-90 ml (2.5-3 ounces) for 8 feedings a day or 540-720 ml (18-24 ounces) in 24 hours. Infant may take more or less at each feeding depending on how often she feeds. Feed infant until she is satisfied.  10. Would you recommend that you pump each time infant is getting a bottle to protect and promote your milk supply. Pump both breasts at the same time with your double electric breast pump 11. Keep up the good work 12. Thank you for allowing me to assist you today 13. Please call with any questions/concerns as needed [phone] 14. Follow up with Lactation as needed    Ed Blalock RN, IBCLC                                                      Silas Flood Malinda Mayden 07/22/2019, 3:06 PM

## 2022-11-22 DIAGNOSIS — R052 Subacute cough: Secondary | ICD-10-CM | POA: Diagnosis not present

## 2022-11-22 DIAGNOSIS — R41840 Attention and concentration deficit: Secondary | ICD-10-CM | POA: Diagnosis not present

## 2022-12-17 DIAGNOSIS — Z3009 Encounter for other general counseling and advice on contraception: Secondary | ICD-10-CM | POA: Diagnosis not present

## 2022-12-31 DIAGNOSIS — Z30430 Encounter for insertion of intrauterine contraceptive device: Secondary | ICD-10-CM | POA: Diagnosis not present

## 2022-12-31 DIAGNOSIS — Z3046 Encounter for surveillance of implantable subdermal contraceptive: Secondary | ICD-10-CM | POA: Diagnosis not present

## 2023-01-03 DIAGNOSIS — R41840 Attention and concentration deficit: Secondary | ICD-10-CM | POA: Diagnosis not present

## 2023-01-03 DIAGNOSIS — I952 Hypotension due to drugs: Secondary | ICD-10-CM | POA: Diagnosis not present

## 2023-01-29 DIAGNOSIS — E782 Mixed hyperlipidemia: Secondary | ICD-10-CM | POA: Diagnosis not present

## 2023-01-29 DIAGNOSIS — Z0000 Encounter for general adult medical examination without abnormal findings: Secondary | ICD-10-CM | POA: Diagnosis not present

## 2023-01-29 DIAGNOSIS — Z114 Encounter for screening for human immunodeficiency virus [HIV]: Secondary | ICD-10-CM | POA: Diagnosis not present

## 2023-01-29 DIAGNOSIS — Z13220 Encounter for screening for lipoid disorders: Secondary | ICD-10-CM | POA: Diagnosis not present

## 2023-01-29 DIAGNOSIS — Z1159 Encounter for screening for other viral diseases: Secondary | ICD-10-CM | POA: Diagnosis not present

## 2023-01-29 DIAGNOSIS — R41840 Attention and concentration deficit: Secondary | ICD-10-CM | POA: Diagnosis not present

## 2023-02-11 DIAGNOSIS — Z30431 Encounter for routine checking of intrauterine contraceptive device: Secondary | ICD-10-CM | POA: Diagnosis not present
